# Patient Record
Sex: Female | Born: 2005
Health system: Southern US, Community
[De-identification: ages and names within clinical notes are randomized; demographics above are authoritative.]

## PROBLEM LIST (undated history)

## (undated) DIAGNOSIS — T7840XA Allergy, unspecified, initial encounter: Secondary | ICD-10-CM

## (undated) HISTORY — PX: TONSILLECTOMY AND ADENOIDECTOMY: SHX28

## (undated) HISTORY — PX: TONSILLECTOMY: SUR1361

## (undated) HISTORY — PX: COSMETIC SURGERY: SHX468

## (undated) HISTORY — DX: Allergy, unspecified, initial encounter: T78.40XA

---
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
## 2024-01-04 DIAGNOSIS — H903 Sensorineural hearing loss, bilateral: Secondary | ICD-10-CM | POA: Diagnosis not present

## 2024-01-17 DIAGNOSIS — H1045 Other chronic allergic conjunctivitis: Secondary | ICD-10-CM | POA: Diagnosis not present

## 2024-01-17 DIAGNOSIS — R062 Wheezing: Secondary | ICD-10-CM | POA: Diagnosis not present

## 2024-01-17 DIAGNOSIS — J301 Allergic rhinitis due to pollen: Secondary | ICD-10-CM | POA: Diagnosis not present

## 2024-01-17 DIAGNOSIS — R21 Rash and other nonspecific skin eruption: Secondary | ICD-10-CM | POA: Diagnosis not present

## 2024-07-31 DIAGNOSIS — T148XXA Other injury of unspecified body region, initial encounter: Secondary | ICD-10-CM | POA: Diagnosis not present

## 2024-08-02 ENCOUNTER — Telehealth: Payer: Self-pay | Admitting: Pediatrics

## 2024-08-02 NOTE — Telephone Encounter (Signed)
 Please review and advise back to care team.

## 2024-08-02 NOTE — Telephone Encounter (Signed)
" °  Please advise   Copied from CRM [id number]. Topic: Appointments - Scheduling Inquiry for Clinic >> Aug 02, 2024 10:05 AM Hadassah PARAS wrote: Reason for CRM: Pt's mother Nena was calling to set up app with Dr. Lavern Heck. Advised she was not taking pt's at this time. Pt was advised by Dr. Heck that she would take her daughter in. Please advise mother on [phone] "

## 2024-08-06 NOTE — Telephone Encounter (Signed)
 Spoke to mother. She will look about finding another PCP.
